# Patient Record
Sex: Female | Born: 2005
Health system: Southern US, Community
[De-identification: ages and names within clinical notes are randomized; demographics above are authoritative.]

## PROBLEM LIST (undated history)

## (undated) DIAGNOSIS — R569 Unspecified convulsions: Secondary | ICD-10-CM

## (undated) HISTORY — PX: TONSILLECTOMY: SUR1361

## (undated) HISTORY — PX: ADENOIDECTOMY: SUR15

---
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
## 2024-01-11 DIAGNOSIS — F4481 Dissociative identity disorder: Secondary | ICD-10-CM | POA: Diagnosis not present

## 2024-01-11 DIAGNOSIS — F4312 Post-traumatic stress disorder, chronic: Secondary | ICD-10-CM | POA: Diagnosis not present

## 2024-01-18 DIAGNOSIS — F4312 Post-traumatic stress disorder, chronic: Secondary | ICD-10-CM | POA: Diagnosis not present

## 2024-01-18 DIAGNOSIS — F4481 Dissociative identity disorder: Secondary | ICD-10-CM | POA: Diagnosis not present

## 2024-01-25 DIAGNOSIS — F4312 Post-traumatic stress disorder, chronic: Secondary | ICD-10-CM | POA: Diagnosis not present

## 2024-01-25 DIAGNOSIS — F4481 Dissociative identity disorder: Secondary | ICD-10-CM | POA: Diagnosis not present

## 2024-02-08 DIAGNOSIS — F4312 Post-traumatic stress disorder, chronic: Secondary | ICD-10-CM | POA: Diagnosis not present

## 2024-02-08 DIAGNOSIS — F4481 Dissociative identity disorder: Secondary | ICD-10-CM | POA: Diagnosis not present

## 2024-02-15 DIAGNOSIS — F4481 Dissociative identity disorder: Secondary | ICD-10-CM | POA: Diagnosis not present

## 2024-02-15 DIAGNOSIS — F4312 Post-traumatic stress disorder, chronic: Secondary | ICD-10-CM | POA: Diagnosis not present

## 2024-02-29 DIAGNOSIS — F4312 Post-traumatic stress disorder, chronic: Secondary | ICD-10-CM | POA: Diagnosis not present

## 2024-02-29 DIAGNOSIS — F4481 Dissociative identity disorder: Secondary | ICD-10-CM | POA: Diagnosis not present

## 2024-03-07 DIAGNOSIS — F4312 Post-traumatic stress disorder, chronic: Secondary | ICD-10-CM | POA: Diagnosis not present

## 2024-03-14 DIAGNOSIS — F4312 Post-traumatic stress disorder, chronic: Secondary | ICD-10-CM | POA: Diagnosis not present

## 2024-03-14 DIAGNOSIS — F4481 Dissociative identity disorder: Secondary | ICD-10-CM | POA: Diagnosis not present

## 2024-03-21 DIAGNOSIS — F4312 Post-traumatic stress disorder, chronic: Secondary | ICD-10-CM | POA: Diagnosis not present

## 2024-03-21 DIAGNOSIS — F4481 Dissociative identity disorder: Secondary | ICD-10-CM | POA: Diagnosis not present

## 2024-03-28 DIAGNOSIS — F4312 Post-traumatic stress disorder, chronic: Secondary | ICD-10-CM | POA: Diagnosis not present

## 2024-03-28 DIAGNOSIS — F4481 Dissociative identity disorder: Secondary | ICD-10-CM | POA: Diagnosis not present

## 2024-04-04 DIAGNOSIS — F4312 Post-traumatic stress disorder, chronic: Secondary | ICD-10-CM | POA: Diagnosis not present

## 2024-04-04 DIAGNOSIS — F4481 Dissociative identity disorder: Secondary | ICD-10-CM | POA: Diagnosis not present

## 2024-04-11 DIAGNOSIS — F4312 Post-traumatic stress disorder, chronic: Secondary | ICD-10-CM | POA: Diagnosis not present

## 2024-04-11 DIAGNOSIS — F4481 Dissociative identity disorder: Secondary | ICD-10-CM | POA: Diagnosis not present

## 2024-04-18 DIAGNOSIS — F4312 Post-traumatic stress disorder, chronic: Secondary | ICD-10-CM | POA: Diagnosis not present

## 2024-04-18 DIAGNOSIS — F4481 Dissociative identity disorder: Secondary | ICD-10-CM | POA: Diagnosis not present

## 2024-04-25 DIAGNOSIS — F4312 Post-traumatic stress disorder, chronic: Secondary | ICD-10-CM | POA: Diagnosis not present

## 2024-04-25 DIAGNOSIS — F4481 Dissociative identity disorder: Secondary | ICD-10-CM | POA: Diagnosis not present

## 2024-05-03 DIAGNOSIS — F4312 Post-traumatic stress disorder, chronic: Secondary | ICD-10-CM | POA: Diagnosis not present

## 2024-05-03 DIAGNOSIS — F4481 Dissociative identity disorder: Secondary | ICD-10-CM | POA: Diagnosis not present

## 2024-05-09 DIAGNOSIS — F4481 Dissociative identity disorder: Secondary | ICD-10-CM | POA: Diagnosis not present

## 2024-05-09 DIAGNOSIS — F4312 Post-traumatic stress disorder, chronic: Secondary | ICD-10-CM | POA: Diagnosis not present

## 2024-06-06 DIAGNOSIS — F4481 Dissociative identity disorder: Secondary | ICD-10-CM | POA: Diagnosis not present

## 2024-06-06 DIAGNOSIS — F4312 Post-traumatic stress disorder, chronic: Secondary | ICD-10-CM | POA: Diagnosis not present

## 2024-06-13 DIAGNOSIS — F4312 Post-traumatic stress disorder, chronic: Secondary | ICD-10-CM | POA: Diagnosis not present

## 2024-06-13 DIAGNOSIS — F4481 Dissociative identity disorder: Secondary | ICD-10-CM | POA: Diagnosis not present

## 2024-06-20 DIAGNOSIS — F4481 Dissociative identity disorder: Secondary | ICD-10-CM | POA: Diagnosis not present

## 2024-06-20 DIAGNOSIS — F4312 Post-traumatic stress disorder, chronic: Secondary | ICD-10-CM | POA: Diagnosis not present

## 2024-06-27 DIAGNOSIS — F4312 Post-traumatic stress disorder, chronic: Secondary | ICD-10-CM | POA: Diagnosis not present

## 2024-06-27 DIAGNOSIS — F4481 Dissociative identity disorder: Secondary | ICD-10-CM | POA: Diagnosis not present

## 2024-07-04 DIAGNOSIS — F4481 Dissociative identity disorder: Secondary | ICD-10-CM | POA: Diagnosis not present

## 2024-07-04 DIAGNOSIS — F4312 Post-traumatic stress disorder, chronic: Secondary | ICD-10-CM | POA: Diagnosis not present

## 2024-07-07 DIAGNOSIS — Z23 Encounter for immunization: Secondary | ICD-10-CM | POA: Diagnosis not present

## 2024-07-11 DIAGNOSIS — F4312 Post-traumatic stress disorder, chronic: Secondary | ICD-10-CM | POA: Diagnosis not present

## 2024-07-11 DIAGNOSIS — F4481 Dissociative identity disorder: Secondary | ICD-10-CM | POA: Diagnosis not present

## 2024-07-26 DIAGNOSIS — F4312 Post-traumatic stress disorder, chronic: Secondary | ICD-10-CM | POA: Diagnosis not present

## 2024-07-26 DIAGNOSIS — F4481 Dissociative identity disorder: Secondary | ICD-10-CM | POA: Diagnosis not present

## 2024-08-02 DIAGNOSIS — F4481 Dissociative identity disorder: Secondary | ICD-10-CM | POA: Diagnosis not present

## 2024-08-02 DIAGNOSIS — F4312 Post-traumatic stress disorder, chronic: Secondary | ICD-10-CM | POA: Diagnosis not present

## 2024-08-16 DIAGNOSIS — F4312 Post-traumatic stress disorder, chronic: Secondary | ICD-10-CM | POA: Diagnosis not present

## 2024-08-16 DIAGNOSIS — F4481 Dissociative identity disorder: Secondary | ICD-10-CM | POA: Diagnosis not present

## 2024-08-23 DIAGNOSIS — F4312 Post-traumatic stress disorder, chronic: Secondary | ICD-10-CM | POA: Diagnosis not present

## 2024-08-23 DIAGNOSIS — F4481 Dissociative identity disorder: Secondary | ICD-10-CM | POA: Diagnosis not present

## 2024-08-30 DIAGNOSIS — F4481 Dissociative identity disorder: Secondary | ICD-10-CM | POA: Diagnosis not present

## 2024-08-30 DIAGNOSIS — F4312 Post-traumatic stress disorder, chronic: Secondary | ICD-10-CM | POA: Diagnosis not present

## 2024-09-06 DIAGNOSIS — F4312 Post-traumatic stress disorder, chronic: Secondary | ICD-10-CM | POA: Diagnosis not present

## 2024-09-27 DIAGNOSIS — F4312 Post-traumatic stress disorder, chronic: Secondary | ICD-10-CM | POA: Diagnosis not present

## 2024-09-27 DIAGNOSIS — F4481 Dissociative identity disorder: Secondary | ICD-10-CM | POA: Diagnosis not present

## 2024-10-04 DIAGNOSIS — F4312 Post-traumatic stress disorder, chronic: Secondary | ICD-10-CM | POA: Diagnosis not present

## 2024-10-04 DIAGNOSIS — F4481 Dissociative identity disorder: Secondary | ICD-10-CM | POA: Diagnosis not present

## 2024-10-11 DIAGNOSIS — F4481 Dissociative identity disorder: Secondary | ICD-10-CM | POA: Diagnosis not present

## 2024-10-11 DIAGNOSIS — F4312 Post-traumatic stress disorder, chronic: Secondary | ICD-10-CM | POA: Diagnosis not present

## 2024-10-18 DIAGNOSIS — F4481 Dissociative identity disorder: Secondary | ICD-10-CM | POA: Diagnosis not present

## 2024-10-18 DIAGNOSIS — F4312 Post-traumatic stress disorder, chronic: Secondary | ICD-10-CM | POA: Diagnosis not present

## 2024-10-25 DIAGNOSIS — F4312 Post-traumatic stress disorder, chronic: Secondary | ICD-10-CM | POA: Diagnosis not present

## 2024-11-01 DIAGNOSIS — F4481 Dissociative identity disorder: Secondary | ICD-10-CM | POA: Diagnosis not present

## 2024-11-01 DIAGNOSIS — F4312 Post-traumatic stress disorder, chronic: Secondary | ICD-10-CM | POA: Diagnosis not present

## 2024-11-08 DIAGNOSIS — F4481 Dissociative identity disorder: Secondary | ICD-10-CM | POA: Diagnosis not present

## 2024-11-08 DIAGNOSIS — F4312 Post-traumatic stress disorder, chronic: Secondary | ICD-10-CM | POA: Diagnosis not present

## 2024-11-15 DIAGNOSIS — F4481 Dissociative identity disorder: Secondary | ICD-10-CM | POA: Diagnosis not present

## 2024-11-15 DIAGNOSIS — F4312 Post-traumatic stress disorder, chronic: Secondary | ICD-10-CM | POA: Diagnosis not present

## 2024-11-29 DIAGNOSIS — F4481 Dissociative identity disorder: Secondary | ICD-10-CM | POA: Diagnosis not present

## 2024-11-29 DIAGNOSIS — F4312 Post-traumatic stress disorder, chronic: Secondary | ICD-10-CM | POA: Diagnosis not present
